# Patient Record
Sex: Male | Born: 1980 | Race: Black or African American | Hispanic: No | Marital: Single | Smoking: Current every day smoker
Health system: Southern US, Community
[De-identification: ages and names within clinical notes are randomized; demographics above are authoritative.]

---

## 2001-06-26 ENCOUNTER — Encounter: Payer: Self-pay | Admitting: Emergency Medicine

## 2001-06-26 ENCOUNTER — Emergency Department (HOSPITAL_COMMUNITY): Admission: EM | Admit: 2001-06-26 | Discharge: 2001-06-26 | Payer: Self-pay | Admitting: Emergency Medicine

## 2007-06-07 ENCOUNTER — Emergency Department (HOSPITAL_COMMUNITY): Admission: EM | Admit: 2007-06-07 | Discharge: 2007-06-08 | Payer: Self-pay | Admitting: Emergency Medicine

## 2007-10-20 ENCOUNTER — Emergency Department (HOSPITAL_COMMUNITY): Admission: EM | Admit: 2007-10-20 | Discharge: 2007-10-20 | Payer: Self-pay | Admitting: Family Medicine

## 2008-01-03 ENCOUNTER — Emergency Department (HOSPITAL_COMMUNITY): Admission: EM | Admit: 2008-01-03 | Discharge: 2008-01-03 | Payer: Self-pay | Admitting: Family Medicine

## 2008-02-28 ENCOUNTER — Emergency Department (HOSPITAL_COMMUNITY): Admission: EM | Admit: 2008-02-28 | Discharge: 2008-02-28 | Payer: Self-pay | Admitting: Family Medicine

## 2008-06-08 ENCOUNTER — Emergency Department (HOSPITAL_COMMUNITY): Admission: EM | Admit: 2008-06-08 | Discharge: 2008-06-08 | Payer: Self-pay | Admitting: Emergency Medicine

## 2008-07-09 ENCOUNTER — Emergency Department (HOSPITAL_COMMUNITY): Admission: EM | Admit: 2008-07-09 | Discharge: 2008-07-09 | Payer: Self-pay | Admitting: Emergency Medicine

## 2014-08-12 ENCOUNTER — Encounter (HOSPITAL_COMMUNITY): Payer: Self-pay | Admitting: Emergency Medicine

## 2014-08-12 ENCOUNTER — Emergency Department (HOSPITAL_COMMUNITY)
Admission: EM | Admit: 2014-08-12 | Discharge: 2014-08-13 | Disposition: A | Discharge status: Home / Self Care | Payer: Medicare Other | Attending: Emergency Medicine | Admitting: Emergency Medicine

## 2014-08-12 DIAGNOSIS — Z720 Tobacco use: Secondary | ICD-10-CM | POA: Insufficient documentation

## 2014-08-12 DIAGNOSIS — F1210 Cannabis abuse, uncomplicated: Secondary | ICD-10-CM | POA: Diagnosis not present

## 2014-08-12 DIAGNOSIS — R45850 Homicidal ideations: Secondary | ICD-10-CM

## 2014-08-12 DIAGNOSIS — F419 Anxiety disorder, unspecified: Secondary | ICD-10-CM | POA: Insufficient documentation

## 2014-08-12 DIAGNOSIS — F911 Conduct disorder, childhood-onset type: Secondary | ICD-10-CM | POA: Diagnosis not present

## 2014-08-12 DIAGNOSIS — Z046 Encounter for general psychiatric examination, requested by authority: Secondary | ICD-10-CM | POA: Diagnosis present

## 2014-08-12 DIAGNOSIS — R4689 Other symptoms and signs involving appearance and behavior: Secondary | ICD-10-CM

## 2014-08-12 DIAGNOSIS — F39 Unspecified mood [affective] disorder: Secondary | ICD-10-CM | POA: Diagnosis present

## 2014-08-12 LAB — ETHANOL

## 2014-08-12 LAB — SALICYLATE LEVEL

## 2014-08-12 LAB — CBC
HCT: 39.1 % (ref 39.0–52.0)
Hemoglobin: 13 g/dL (ref 13.0–17.0)
MCH: 26.9 pg (ref 26.0–34.0)
MCHC: 33.2 g/dL (ref 30.0–36.0)
MCV: 81 fL (ref 78.0–100.0)
PLATELETS: 284 10*3/uL (ref 150–400)
RBC: 4.83 MIL/uL (ref 4.22–5.81)
RDW: 14.3 % (ref 11.5–15.5)
WBC: 8.5 10*3/uL (ref 4.0–10.5)

## 2014-08-12 LAB — COMPREHENSIVE METABOLIC PANEL
ALT: 14 U/L — ABNORMAL LOW (ref 17–63)
AST: 18 U/L (ref 15–41)
Albumin: 4.3 g/dL (ref 3.5–5.0)
Alkaline Phosphatase: 83 U/L (ref 38–126)
Anion gap: 7 (ref 5–15)
BUN: 13 mg/dL (ref 6–20)
CO2: 26 mmol/L (ref 22–32)
Calcium: 9.3 mg/dL (ref 8.9–10.3)
Chloride: 105 mmol/L (ref 101–111)
Creatinine, Ser: 0.84 mg/dL (ref 0.61–1.24)
GFR calc non Af Amer: >60 mL/min (ref >60)
Glucose, Bld: 85 mg/dL (ref 65–99)
Potassium: 4.2 mmol/L (ref 3.5–5.1)
Sodium: 138 mmol/L (ref 135–145)
TOTAL PROTEIN: 7.7 g/dL (ref 6.5–8.1)
Total Bilirubin: 0.4 mg/dL (ref 0.3–1.2)

## 2014-08-12 LAB — RAPID URINE DRUG SCREEN, HOSP PERFORMED
Amphetamines: NOT DETECTED
Barbiturates: NOT DETECTED
Benzodiazepines: NOT DETECTED
Cocaine: NOT DETECTED
OPIATES: NOT DETECTED
Tetrahydrocannabinol: POSITIVE — AB

## 2014-08-12 LAB — ACETAMINOPHEN LEVEL: Acetaminophen (Tylenol), Serum: <10 ug/mL — ABNORMAL LOW (ref 10–30)

## 2014-08-12 MED ORDER — LORAZEPAM 0.5 MG PO TABS: 1.0000 mg | ORAL_TABLET | Freq: Three times a day (TID) | ORAL | Status: DC | PRN

## 2014-08-12 MED ORDER — ACETAMINOPHEN 325 MG PO TABS: 650.0000 mg | ORAL_TABLET | ORAL | Status: DC | PRN

## 2014-08-12 MED ORDER — IBUPROFEN 200 MG PO TABS: 600.0000 mg | ORAL_TABLET | Freq: Three times a day (TID) | ORAL | Status: DC | PRN

## 2014-08-12 MED ORDER — NICOTINE 21 MG/24HR TD PT24
21.0000 mg | MEDICATED_PATCH | Freq: Every day | TRANSDERMAL | Status: DC
  Administered 2014-08-12 – 2014-08-13 (×2): 21 mg via TRANSDERMAL
  Filled 2014-08-12 (×2): qty 1

## 2014-08-12 MED ORDER — ONDANSETRON HCL 4 MG PO TABS: 4.0000 mg | ORAL_TABLET | Freq: Three times a day (TID) | ORAL | Status: DC | PRN

## 2014-08-12 NOTE — ED Notes (Signed)
Pt sent [facility]  Pt has stopped taking his psych meds and he thinks he does not need them anymore  Pt threatened a resident [facility] with a box cutter today because he did not like the way he was looking at him  Pt is c/o toothache in triage

## 2014-08-12 NOTE — ED Notes (Signed)
Bed: [id number] Expected date: 08/12/14 Expected time: 7:04 AM Means of arrival: Ambulance Comments: Short of breath

## 2014-08-12 NOTE — ED Notes (Signed)
PA at bedside.

## 2014-08-12 NOTE — BH Assessment (Signed)
[facility] Assessment Progress Note  Per Olivia Mackieressa, pt has been declined for admission to [facility].  Doylene Canninghomas Nya Monds, MA Triage Specialist [phone]

## 2014-08-12 NOTE — ED Notes (Signed)
Pt was given motrin 800mg  [facility]100  [facility] is not to return to [facility]  Per paperwork the pt refused his psych meds because he thought they were trying to poison him

## 2014-08-12 NOTE — ED Provider Notes (Signed)
CSN: [id number]     Arrival date & time 08/12/14  4098 History   First MD Initiated Contact with Patient 08/12/14 0654     Chief Complaint  Patient presents with  . IVC      (Consider location/radiation/quality/duration/timing/severity/associated sxs/prior Treatment) HPI Charles Cross is a 34 y.o. male who is brought to the emergency department from [facility] under IVC. Patient apparently was threatening other residents of [facility] with box cutter. He was involuntary committed for homicidal ideations and psychiatric illness. Patient apparently stopped taking his psychiatric medications. Patient told me that "I have everything under control." He also told me that he wanted to "eat his face out if he came it me again." Patient states he does not have any suicidal ideations. He denies alcohol or drugs other marijuana. He is also complaining of a right upper dental pain that has been going on for years. He denies any injuries. He has no other complaints otherwise.  History reviewed. No pertinent past medical history. History reviewed. No pertinent past surgical history. Family History  Problem Relation Age of Onset  . Diabetes Mother   . Diabetes Other    History  Substance Use Topics  . Smoking status: Current Every Day Smoker  . Smokeless tobacco: Not on file  . Alcohol Use: Yes     Comment: rare    Review of Systems  Constitutional: Negative for fever and chills.  HENT: Positive for dental problem.   Respiratory: Negative for cough, chest tightness and shortness of breath.   Cardiovascular: Negative for chest pain, palpitations and leg swelling.  Gastrointestinal: Negative for nausea, vomiting, abdominal pain, diarrhea and abdominal distention.  Musculoskeletal: Negative for myalgias, neck pain and neck stiffness.  Skin: Negative for rash.  Allergic/Immunologic: Negative for immunocompromised state.  Neurological: Negative for dizziness, weakness, light-headedness, numbness and headaches.   Psychiatric/Behavioral: Positive for agitation. The patient is nervous/anxious.        Homicidal ideations  All other systems reviewed and are negative.     Allergies  Review of patient's allergies indicates no known allergies.  Home Medications   Prior to Admission medications   Not on File   BP 127/75 mmHg  Pulse 60  Temp(Src) 98.3 F (36.8 C) (Oral)  Resp 16  SpO2 100% Physical Exam  Constitutional: He is oriented to person, place, and time. He appears well-developed and well-nourished. No distress.  HENT:  Head: Normocephalic and atraumatic.  Nose: Nose normal.  Mouth/Throat: Oropharynx is clear and moist.  Normal dentition. TTP over right upper 3rd molar. No obvious decay or infection seen  Eyes: Conjunctivae are normal.  Neck: Neck supple.  Cardiovascular: Normal rate, regular rhythm and normal heart sounds.   Pulmonary/Chest: Effort normal. No respiratory distress. He has no wheezes. He has no rales.  Abdominal: Soft. Bowel sounds are normal. He exhibits no distension. There is no tenderness. There is no rebound.  Musculoskeletal: He exhibits no edema.  Neurological: He is alert and oriented to person, place, and time.  Skin: Skin is warm and dry.  Psychiatric: His mood appears anxious. His speech is delayed and tangential. He is slowed.  Avoiding eye contact  Nursing note and vitals reviewed.   ED Course  Procedures (including critical care time) Labs Review Labs Reviewed  COMPREHENSIVE METABOLIC PANEL - Abnormal; Notable for the following:    ALT 14 (*)    All other components within normal limits  ACETAMINOPHEN LEVEL - Abnormal; Notable for the following:    Acetaminophen (Tylenol), Serum <  10 (*)    All other components within normal limits  URINE RAPID DRUG SCREEN, HOSP PERFORMED - Abnormal; Notable for the following:    Tetrahydrocannabinol POSITIVE (*)    All other components within normal limits  ETHANOL  SALICYLATE LEVEL  CBC    Imaging  Review No results found.   EKG Interpretation None      MDM   Final diagnoses:  Aggressive behavior  Homicidal ideation     patient here under involuntary commitment, [facility] where he was already assessed. Patient admitted to me that he was angry at another person and wanted to cut him with a box cutter and eat his face. Patient apparently has been off of his psychiatric medications. He states he has everything under control. Will get TTS to assess in place as needed. Labs pending.   Pt medically cleared. IVCed. Waiting assessment and placement?   Filed Vitals:   08/12/14 0524 08/12/14 1420  BP: 127/75 118/91  Pulse: 60 75  Temp: 98.3 F (36.8 C) 98.7 F (37.1 C)  TempSrc: Oral Oral  Resp: 16 18  SpO2: 100% 100%     Jaynie Crumbleatyana Ida Uppal, PA-C 08/13/14 1645  John Molpus, MD 08/13/14 2241

## 2014-08-12 NOTE — ED Notes (Signed)
Pt states he is allergic to all psychiatric medications  States they make him want to hurt himself and other people when he takes them

## 2014-08-12 NOTE — ED Notes (Signed)
Pt awake, alert & responsive, no distress noted.  Pt requesting that door stay closed to room and stating that he doesn't take any meds.  Monitoring for safety, Q 15 min checks in effect.

## 2014-08-12 NOTE — Consult Note (Signed)
[place] Psychiatry Consult   Reason for Consult:  Mood disorder Referring Physician:  EDP Patient Identification: Jorge Retz MRN:  [id number] Principal Diagnosis: Mood disorder Diagnosis:   Patient Active Problem List   Diagnosis Date Noted  . Mood disorder [F39] 08/12/2014    Priority: High    Total Time spent with patient: 1 hour  Subjective:   Manfred Laspina is a 34 y.o. male patient admitted with Mood disorder.  HPI: AA male, 33 years old was evaluated after he was IVC by [facility] for threatening another client at [facility].  Patient reported that he is homeless and was at [facility] when a "gay man" tried to make a pass on him.  He admitted that he brought out his box cutter and warned the man that he he made another pass on him he will cut his face and eat him up.  Patient admitted to past mental illness as a child and stated that he was placed on medications for mental illness.  He stated that he stopped taking all of his medications long time ago because all of the medications made him sick.  Patient could not remember his past medications and when he actually stopped taking medications.  Patient presented angry and agitated when talking about the pass made at him by the ":gay man"  Patient will be observed  Overnight and will be re-evaluated in am.  He already stated that he is not willing to take any antipsychotic medications.  His UDS is positive for Marijuanana and Alcohol is less than 5.  HPI Elements:   Location:  Mood disorder, unspecified. Quality:  severe. Severity:  severe. Timing:  Acute. Duration:  sudden. Context:  IVC by [facility] for threatening another client at [facility].  Past Medical History: History reviewed. No pertinent past medical history. History reviewed. No pertinent past surgical history. Family History:  Family History  Problem Relation Age of Onset  . Diabetes Mother   . Diabetes Other    Social History:  History  Alcohol Use  . Yes    Comment: rare      History  Drug Use  . Yes  . Special: Marijuana    History   Social History  . Marital Status: Single    Spouse Name: N/A  . Number of Children: N/A  . Years of Education: N/A   Social History Main Topics  . Smoking status: Current Every Day Smoker  . Smokeless tobacco: Not on file  . Alcohol Use: Yes     Comment: rare  . Drug Use: Yes    Special: Marijuana  . Sexual Activity: Not on file   Other Topics Concern  . None   Social History Narrative  . None   Additional Social History:    Pain Medications: SEE MAR Prescriptions: SEE MAR Over the Counter: SEE MAR History of alcohol / drug use?: Yes Name of Substance 1: THC 1 - Age of First Use: 34 yrs old  1 - Amount (size/oz): 1-2 blunts 1 - Frequency: daily for several years 1 - Duration: on-going  1 - Last Use / Amount: yesterday                   Allergies:  No Known Allergies  Labs:  Results for orders placed or performed during the hospital encounter of 08/12/14 (from the past 48 hour(s))  Comprehensive metabolic panel     Status: Abnormal   Collection Time: 08/12/14  5:45 AM  Result Value Ref Range  Sodium 138 135 - 145 mmol/L   Potassium 4.2 3.5 - 5.1 mmol/L   Chloride 105 101 - 111 mmol/L   CO2 26 22 - 32 mmol/L   Glucose, Bld 85 65 - 99 mg/dL   BUN 13 6 - 20 mg/dL   Creatinine, Ser 0.84 0.61 - 1.24 mg/dL   Calcium 9.3 8.9 - 10.3 mg/dL   Total Protein 7.7 6.5 - 8.1 g/dL   Albumin 4.3 3.5 - 5.0 g/dL   AST 18 15 - 41 U/L   ALT 14 (L) 17 - 63 U/L   Alkaline Phosphatase 83 38 - 126 U/L   Total Bilirubin 0.4 0.3 - 1.2 mg/dL   GFR calc non Af Amer >60 >60 mL/min   GFR calc Af Amer >60 >60 mL/min    Comment: (NOTE) The eGFR has been calculated using the CKD EPI equation. This calculation has not been validated in all clinical situations. eGFR's persistently <60 mL/min signify possible Chronic Kidney Disease.    Anion gap 7 5 - 15  CBC     Status: None   Collection Time: 08/12/14  5:45 AM   Result Value Ref Range   WBC 8.5 4.0 - 10.5 K/uL   RBC 4.83 4.22 - 5.81 MIL/uL   Hemoglobin 13.0 13.0 - 17.0 g/dL   HCT 39.1 39.0 - 52.0 %   MCV 81.0 78.0 - 100.0 fL   MCH 26.9 26.0 - 34.0 pg   MCHC 33.2 30.0 - 36.0 g/dL   RDW 14.3 11.5 - 15.5 %   Platelets 284 150 - 400 K/uL  Ethanol (ETOH)     Status: None   Collection Time: 08/12/14  5:46 AM  Result Value Ref Range   Alcohol, Ethyl (B) <5 <5 mg/dL    Comment:        LOWEST DETECTABLE LIMIT FOR SERUM ALCOHOL IS 5 mg/dL FOR MEDICAL PURPOSES ONLY   Salicylate level     Status: None   Collection Time: 08/12/14  5:46 AM  Result Value Ref Range   Salicylate Lvl <3.3 2.8 - 30.0 mg/dL  Acetaminophen level     Status: Abnormal   Collection Time: 08/12/14  5:46 AM  Result Value Ref Range   Acetaminophen (Tylenol), Serum <10 (L) 10 - 30 ug/mL    Comment:        THERAPEUTIC CONCENTRATIONS VARY SIGNIFICANTLY. A RANGE OF 10-30 ug/mL MAY BE AN EFFECTIVE CONCENTRATION FOR MANY PATIENTS. HOWEVER, SOME ARE BEST TREATED AT CONCENTRATIONS OUTSIDE THIS RANGE. ACETAMINOPHEN CONCENTRATIONS >150 ug/mL AT 4 HOURS AFTER INGESTION AND >50 ug/mL AT 12 HOURS AFTER INGESTION ARE OFTEN ASSOCIATED WITH TOXIC REACTIONS.   Urine rapid drug screen (hosp performed)     Status: Abnormal   Collection Time: 08/12/14  5:47 AM  Result Value Ref Range   Opiates NONE DETECTED NONE DETECTED   Cocaine NONE DETECTED NONE DETECTED   Benzodiazepines NONE DETECTED NONE DETECTED   Amphetamines NONE DETECTED NONE DETECTED   Tetrahydrocannabinol POSITIVE (A) NONE DETECTED   Barbiturates NONE DETECTED NONE DETECTED    Comment:        DRUG SCREEN FOR MEDICAL PURPOSES ONLY.  IF CONFIRMATION IS NEEDED FOR ANY PURPOSE, NOTIFY LAB WITHIN 5 DAYS.        LOWEST DETECTABLE LIMITS FOR URINE DRUG SCREEN Drug Class       Cutoff (ng/mL) Amphetamine      1000 Barbiturate      200 Benzodiazepine   545 Tricyclics  300 Opiates          300 Cocaine           300 THC              50     Vitals: Blood pressure 122/48, pulse 58, temperature 98.2 F (36.8 C), temperature source Oral, resp. rate 14, SpO2 100 %.  Risk to Self: Suicidal Ideation: No Suicidal Intent: No Is patient at risk for suicide?: No Suicidal Plan?: No Access to Means: No What has been your use of drugs/alcohol within the last 12 months?:  (THC) How many times?:  ("[place]") Other Self Harm Risks:  (banged head on walls, punch brick, punch glass) Triggers for Past Attempts:  ("I can't take psych meds they make me destructive") Intentional Self Injurious Behavior: Damaging Comment - Self Injurious Behavior:  (pt has a hx of punching objects and head banging ) Risk to Others: Homicidal Ideation: No Thoughts of Harm to Others: No Current Homicidal Intent: No Current Homicidal Plan: No Access to Homicidal Means: No Identified Victim:  (n/a) History of harm to others?: No Assessment of Violence: None Noted Violent Behavior Description:  (patient is calm and cooperative ) Does patient have access to weapons?: No Criminal Charges Pending?: No Does patient have a court date: No Prior Inpatient Therapy: Prior Inpatient Therapy: Yes Prior Therapy Dates:  (multiple) Prior Therapy Facilty/Provider(s):  (Pt sts, "I don't know") Reason for Treatment:  (n/a) Prior Outpatient Therapy: Prior Outpatient Therapy: No Prior Therapy Dates:  (past-pt sts that he had a psychiatrist yrs ago in [place]) Prior Therapy Facilty/Provider(s):  (psychiatrist in [place] yrs ago ) Reason for Treatment:  (n/a) Does patient have an ACCT team?: No Does patient have Intensive In-House Services?  : No Does patient have [facility] services? : No Does patient have P4[facility] services?: No  Current Facility-Administered Medications  Medication Dose Route Frequency Provider Last Rate Last Dose  . acetaminophen (TYLENOL) tablet 650 mg  650 mg Oral Q4H PRN Tatyana Kirichenko, PA-C      . ibuprofen (ADVIL,MOTRIN) tablet  600 mg  600 mg Oral Q8H PRN Tatyana Kirichenko, PA-C      . LORazepam (ATIVAN) tablet 1 mg  1 mg Oral Q8H PRN Tatyana Kirichenko, PA-C      . nicotine (NICODERM CQ - dosed in mg/24 hours) patch 21 mg  21 mg Transdermal Daily Tatyana Kirichenko, PA-C   21 mg at 08/12/14 1619  . ondansetron (ZOFRAN) tablet 4 mg  4 mg Oral Q8H PRN Tatyana Kirichenko, PA-C       No current outpatient prescriptions on file.    Musculoskeletal: Strength & Muscle Tone: within normal limits Gait & Station: normal Patient leans: N/A  Psychiatric Specialty Exam: Physical Exam  Review of Systems  Constitutional: Negative.   HENT: Negative.   Eyes: Negative.   Respiratory: Negative.   Cardiovascular: Negative.   Gastrointestinal: Negative.   Genitourinary: Negative.   Skin: Negative.   Neurological: Negative.   Endo/Heme/Allergies: Negative.     Blood pressure 122/48, pulse 58, temperature 98.2 F (36.8 C), temperature source Oral, resp. rate 14, SpO2 100 %.There is no height or weight on file to calculate BMI.  General Appearance: Casual and Fairly Groomed  Engineer, water::  Good  Speech:  Clear and Coherent and Normal Rate  Volume:  Normal  Mood:  Angry and Anxious  Affect:  Congruent  Thought Process:  Coherent, Goal Directed and Intact  Orientation:  Full (Time, Place, and Person)  Thought  Content:  WDL  Suicidal Thoughts:  No  Homicidal Thoughts:  No  Memory:  Immediate;   Good Recent;   Good Remote;   Good  Judgement:  Poor  Insight:  Lacking  Psychomotor Activity:  Normal  Concentration:  Good  Recall:  Good  Fund of Knowledge:Poor  Language: Good  Akathisia:  NA  Handed:  Right  AIMS (if indicated):     Assets:  Desire for Improvement  ADL's:  Intact  Cognition: WNL  Sleep:      Medical Decision Making: Review of Psycho-Social Stressors (1)  Treatment Plan Summary: Daily contact with patient to assess and evaluate symptoms and progress in treatment and Medication  management  Plan:  Offer Trazodone 50 mg at bed time  for sleep, Ativan 1 mg po every 8 hours as needed for agitataion Disposition:  Re-evaluate in am for appropriate disposition.  Delfin Gant  PMHNP-BC 08/12/2014 6:03 PM Patient seen face-to-face for psychiatric evaluation, chart reviewed and case discussed with the physician extender and developed treatment plan. Reviewed the information documented and agree with the treatment plan. Corena Pilgrim, MD

## 2014-08-12 NOTE — Progress Notes (Signed)
[facility]p is [facility] [address] [place], [place] [id number] [phone]

## 2014-08-12 NOTE — ED Notes (Signed)
Oriented pt to room and unit.  Pt is agitated and does not understand why he is here.  He claims that his only problem is that he is homophobic.  He denies SI HI and AVH.   Pt is obsessively rubbing lotion into his hands. I took the lotion that [facility] let him keep because it had a sharp edge and stored it in his drawer.  He continues to say he is not mentally ill over and over and states that his body is a weapon.  He states he knows mixed martial arts.
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
## 2014-08-12 NOTE — Progress Notes (Signed)
Pt [facility].   Maryelizabeth Rowanressa Rheba Diamond, MSW, LCSW, LCAS Clinical Social Worker [phone]

## 2014-08-13 DIAGNOSIS — F911 Conduct disorder, childhood-onset type: Secondary | ICD-10-CM | POA: Diagnosis not present

## 2014-08-13 DIAGNOSIS — R4689 Other symptoms and signs involving appearance and behavior: Secondary | ICD-10-CM | POA: Insufficient documentation
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
## 2014-08-13 NOTE — BH Assessment (Signed)
BHH Assessment Progress Note  Per Thedore MinsMojeed Akintayo, MD, this pt does not require psychiatric hospitalization at this time.  [facility].  IVC has been rescinded.  Pt's discharged instructions include [facility] [place] where he plans to relocate.  Pt's nurse has been notified.  Doylene Canninghomas Toby Breithaupt, MA Triage Specialist [phone]

## 2014-08-13 NOTE — Progress Notes (Signed)
Pt to dc back to [place] today where family lives. Pt states [facility] and he will return to [place] on his own accord. CSW contacted [facility] to make them aware [facility] any confrontation.   Olga CoasterKristen Darek Eifler, LCSW  Clinical Social Work  [facility] [phone]

## 2014-08-13 NOTE — BH Assessment (Signed)
Seeking inpt placement as there are [facility].  Sent referrals to: [facility], [place], [place], [place]    Keeara Frees, [facility] Triage Specialist 08/13/2014 1:59 AM\

## 2014-08-13 NOTE — Discharge Instructions (Signed)
For your ongoing behavioral health needs, you are advised to follow up with [facility].  Contact them at your earliest opportunity to enroll for their services:       [facility]      [address]      [place], [place] [address]      [phone]

## 2014-08-13 NOTE — BHH Suicide Risk Assessment (Cosign Needed)
Suicide Risk Assessment  Discharge Assessment   [facility]   Demographic Factors:  Male, Low socioeconomic status and Unemployed  Total Time spent with patient: 20 minutes  Musculoskeletal: Strength & Muscle Tone: within normal limits Gait & Station: normal Patient leans: N/A  Psychiatric Specialty Exam:     Blood pressure 113/75, pulse 59, temperature 98.3 F (36.8 C), temperature source Oral, resp. rate 18, SpO2 100 %.There is no height or weight on file to calculate BMI.  General Appearance: Casual and Fairly Groomed  Patent attorneyye Contact::  Good  Speech:  Clear and Coherent and Normal Rate  Volume:  Normal  Mood:  Angry and Anxious  Affect:  Congruent  Thought Process:  Coherent, Goal Directed and Intact  Orientation:  Full (Time, Place, and Person)  Thought Content:  WDL  Suicidal Thoughts:  No  Homicidal Thoughts:  No  Memory:  Immediate;   Good Recent;   Good Remote;   Good  Judgement:  Poor  Insight:  Lacking  Psychomotor Activity:  Normal  Concentration:  Good  Recall:  Good  Fund of Knowledge:Poor  Language: Good  Akathisia:  NA  Handed:  Right  AIMS (if indicated):     Assets:  Desire for Improvement  ADL's:  Intact  Cognition: WNL  Sleep:          Has this patient used any form of tobacco in the last 30 days? (Cigarettes, Smokeless Tobacco, Cigars, and/or Pipes) No  Mental Status Per Nursing Assessment::   On Admission:     Current Mental Status by Physician: NA  Loss Factors: NA  Historical Factors: Prior suicide attempts  Risk Reduction Factors:   NA  Continued Clinical Symptoms:  Depression:   Insomnia  Cognitive Features That Contribute To Risk:  Polarized thinking    Suicide Risk:  Minimal: No identifiable suicidal ideation.  Patients presenting with no risk factors but with morbid ruminations; may be classified as minimal risk based on the severity of the depressive symptoms  Principal Problem: Mood  disorder Discharge Diagnoses:  Patient Active Problem List   Diagnosis Date Noted  . Mood disorder [F39] 08/12/2014    Priority: High  . Aggressive behavior [F60.89]     Follow-up Information    Schedule an appointment as soon as possible for a visit with [facility] .   Why:  This is your assigned medicaid [place] access doctor If you prefer another contact DSS [phone]   Contact information:   [facility] [address] [place], [place] [phone]      Plan Of Care/Follow-up recommendations:  Activity:  as tolerated Diet:  regular  Is patient on multiple antipsychotic therapies at discharge:  No   Has Patient had three or more failed trials of antipsychotic monotherapy by history:  No  Recommended Plan for Multiple Antipsychotic Therapies: NA    Maui Britten C   PMHNP-BC 08/13/2014, 2:16 PM
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
## 2014-08-23 DIAGNOSIS — Y9289 Other specified places as the place of occurrence of the external cause: Secondary | ICD-10-CM | POA: Insufficient documentation

## 2014-08-23 DIAGNOSIS — S29002A Unspecified injury of muscle and tendon of back wall of thorax, initial encounter: Secondary | ICD-10-CM | POA: Insufficient documentation

## 2014-08-23 DIAGNOSIS — Y9389 Activity, other specified: Secondary | ICD-10-CM | POA: Diagnosis not present

## 2014-08-23 DIAGNOSIS — Z720 Tobacco use: Secondary | ICD-10-CM | POA: Insufficient documentation

## 2014-08-23 DIAGNOSIS — Y998 Other external cause status: Secondary | ICD-10-CM | POA: Diagnosis not present

## 2014-08-23 NOTE — ED Notes (Signed)
Called pt in waiting room, No response.

## 2014-08-24 ENCOUNTER — Encounter (HOSPITAL_COMMUNITY): Payer: Self-pay | Admitting: Emergency Medicine

## 2014-08-24 ENCOUNTER — Emergency Department (HOSPITAL_COMMUNITY)
Admission: EM | Admit: 2014-08-24 | Discharge: 2014-08-24 | Disposition: A | Discharge status: Home / Self Care | Payer: Medicare Other | Attending: Emergency Medicine | Admitting: Emergency Medicine

## 2014-08-24 ENCOUNTER — Emergency Department (HOSPITAL_COMMUNITY): Payer: Medicare Other

## 2014-08-24 DIAGNOSIS — S29002A Unspecified injury of muscle and tendon of back wall of thorax, initial encounter: Secondary | ICD-10-CM | POA: Diagnosis not present

## 2014-08-24 DIAGNOSIS — S3992XA Unspecified injury of lower back, initial encounter: Secondary | ICD-10-CM

## 2014-08-24 MED ORDER — TRAMADOL HCL 50 MG PO TABS
50.0000 mg | ORAL_TABLET | Freq: Once | ORAL | Status: AC
  Administered 2014-08-24: 50 mg via ORAL
  Filled 2014-08-24: qty 1

## 2014-08-24 MED ORDER — TRAMADOL HCL 50 MG PO TABS: 50.0000 mg | ORAL_TABLET | Freq: Four times a day (QID) | ORAL | Status: AC | PRN

## 2014-08-24 MED ORDER — CYCLOBENZAPRINE HCL 10 MG PO TABS: 10.0000 mg | ORAL_TABLET | Freq: Two times a day (BID) | ORAL | Status: AC | PRN

## 2014-08-24 MED ORDER — CYCLOBENZAPRINE HCL 10 MG PO TABS
10.0000 mg | ORAL_TABLET | Freq: Once | ORAL | Status: AC
  Administered 2014-08-24: 10 mg via ORAL
  Filled 2014-08-24: qty 1
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
## 2014-08-24 NOTE — Discharge Instructions (Signed)
Take Tramadol as needed for pain. Take flexeril as needed for muscle spasm. You may take these medications together.
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
## 2014-08-24 NOTE — ED Notes (Signed)
Pt left with all his belongings and ambulated out of treatment area.  

## 2015-11-09 ENCOUNTER — Encounter (HOSPITAL_COMMUNITY): Payer: Self-pay

## 2015-11-09 ENCOUNTER — Emergency Department (HOSPITAL_COMMUNITY)
Admission: EM | Admit: 2015-11-09 | Discharge: 2015-11-10 | Disposition: A | Discharge status: Home / Self Care | Payer: Medicare Other | Attending: Emergency Medicine | Admitting: Emergency Medicine

## 2015-11-09 DIAGNOSIS — Y939 Activity, unspecified: Secondary | ICD-10-CM | POA: Diagnosis not present

## 2015-11-09 DIAGNOSIS — T07XXXA Unspecified multiple injuries, initial encounter: Secondary | ICD-10-CM

## 2015-11-09 DIAGNOSIS — S4992XA Unspecified injury of left shoulder and upper arm, initial encounter: Secondary | ICD-10-CM | POA: Diagnosis present

## 2015-11-09 DIAGNOSIS — S50312A Abrasion of left elbow, initial encounter: Secondary | ICD-10-CM | POA: Diagnosis not present

## 2015-11-09 DIAGNOSIS — Y92410 Unspecified street and highway as the place of occurrence of the external cause: Secondary | ICD-10-CM | POA: Diagnosis not present

## 2015-11-09 DIAGNOSIS — T148XXA Other injury of unspecified body region, initial encounter: Secondary | ICD-10-CM | POA: Diagnosis not present

## 2015-11-09 DIAGNOSIS — Y999 Unspecified external cause status: Secondary | ICD-10-CM | POA: Insufficient documentation

## 2015-11-09 DIAGNOSIS — F17200 Nicotine dependence, unspecified, uncomplicated: Secondary | ICD-10-CM | POA: Diagnosis not present
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
## 2015-11-10 DIAGNOSIS — T148XXA Other injury of unspecified body region, initial encounter: Secondary | ICD-10-CM | POA: Diagnosis not present

## 2015-11-10 MED ORDER — HYDROCODONE-ACETAMINOPHEN 5-325 MG PO TABS
1.0000 | ORAL_TABLET | Freq: Once | ORAL | Status: AC
  Administered 2015-11-10: 1 via ORAL
  Filled 2015-11-10: qty 1

## 2015-11-10 MED ORDER — IBUPROFEN 200 MG PO TABS
600.0000 mg | ORAL_TABLET | Freq: Once | ORAL | Status: AC
  Administered 2015-11-10: 600 mg via ORAL
  Filled 2015-11-10: qty 3

## 2015-11-10 NOTE — ED Notes (Signed)
Pt is now complaining of LEFT hip pain when walking 2/10

## 2015-11-10 NOTE — ED Notes (Signed)
Discharge instructions reviewed. Pt verbalizes understanding. Attempted to obtain e-signature but pad not working. PT escorted to wr by security.
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
## 2015-11-10 NOTE — ED Notes (Signed)
When asking pt about medical problems pt states "i don't have any. I used to be deemed incompetent but I got my competency back. That psychiatrist has me on all these meds that made me homicidal and suicidal but I quit taking them and I don't have any problems". I continued to ask pt about hx and he would not tell me what he was seeing the psychiatrist for.
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
## 2017-07-03 ENCOUNTER — Emergency Department (HOSPITAL_COMMUNITY)
Admission: EM | Admit: 2017-07-03 | Discharge: 2017-07-04 | Disposition: A | Discharge status: Home / Self Care | Payer: Medicare Other | Attending: Emergency Medicine | Admitting: Emergency Medicine

## 2017-07-03 ENCOUNTER — Other Ambulatory Visit: Payer: Self-pay

## 2017-07-03 ENCOUNTER — Encounter (HOSPITAL_COMMUNITY): Payer: Self-pay | Admitting: Nurse Practitioner

## 2017-07-03 DIAGNOSIS — F17210 Nicotine dependence, cigarettes, uncomplicated: Secondary | ICD-10-CM | POA: Diagnosis not present

## 2017-07-03 DIAGNOSIS — Z79899 Other long term (current) drug therapy: Secondary | ICD-10-CM | POA: Insufficient documentation

## 2017-07-03 DIAGNOSIS — R4689 Other symptoms and signs involving appearance and behavior: Secondary | ICD-10-CM | POA: Diagnosis not present

## 2017-07-03 DIAGNOSIS — F39 Unspecified mood [affective] disorder: Secondary | ICD-10-CM | POA: Insufficient documentation

## 2017-07-03 DIAGNOSIS — Z046 Encounter for general psychiatric examination, requested by authority: Secondary | ICD-10-CM | POA: Insufficient documentation

## 2017-07-03 DIAGNOSIS — R451 Restlessness and agitation: Secondary | ICD-10-CM | POA: Diagnosis not present

## 2017-07-03 DIAGNOSIS — R454 Irritability and anger: Secondary | ICD-10-CM | POA: Diagnosis not present

## 2017-07-03 LAB — ETHANOL: Alcohol, Ethyl (B): <10 mg/dL (ref <10)

## 2017-07-03 LAB — COMPREHENSIVE METABOLIC PANEL
ALT: 13 U/L — ABNORMAL LOW (ref 17–63)
ANION GAP: 9 (ref 5–15)
AST: 18 U/L (ref 15–41)
Albumin: 4.3 g/dL (ref 3.5–5.0)
Alkaline Phosphatase: 64 U/L (ref 38–126)
BUN: 11 mg/dL (ref 6–20)
CHLORIDE: 104 mmol/L (ref 101–111)
CO2: 24 mmol/L (ref 22–32)
CREATININE: 0.83 mg/dL (ref 0.61–1.24)
Calcium: 9.2 mg/dL (ref 8.9–10.3)
GFR calc non Af Amer: >60 mL/min (ref >60)
Glucose, Bld: 83 mg/dL (ref 65–99)
POTASSIUM: 3.5 mmol/L (ref 3.5–5.1)
SODIUM: 137 mmol/L (ref 135–145)
Total Bilirubin: 0.8 mg/dL (ref 0.3–1.2)
Total Protein: 7.2 g/dL (ref 6.5–8.1)

## 2017-07-03 LAB — RAPID URINE DRUG SCREEN, HOSP PERFORMED
AMPHETAMINES: NOT DETECTED
BARBITURATES: NOT DETECTED
BENZODIAZEPINES: NOT DETECTED
COCAINE: NOT DETECTED
Opiates: NOT DETECTED
Tetrahydrocannabinol: POSITIVE — AB

## 2017-07-03 LAB — CBC
HEMATOCRIT: 34.9 % — AB (ref 39.0–52.0)
HEMOGLOBIN: 11.7 g/dL — AB (ref 13.0–17.0)
MCH: 27.3 pg (ref 26.0–34.0)
MCHC: 33.5 g/dL (ref 30.0–36.0)
MCV: 81.4 fL (ref 78.0–100.0)
Platelets: 243 10*3/uL (ref 150–400)
RBC: 4.29 MIL/uL (ref 4.22–5.81)
RDW: 14 % (ref 11.5–15.5)
WBC: 9.3 10*3/uL (ref 4.0–10.5)

## 2017-07-03 MED ORDER — ACETAMINOPHEN 325 MG PO TABS: 650.0000 mg | ORAL_TABLET | ORAL | Status: DC | PRN

## 2017-07-03 MED ORDER — ONDANSETRON HCL 4 MG PO TABS: 4.0000 mg | ORAL_TABLET | Freq: Three times a day (TID) | ORAL | Status: DC | PRN

## 2017-07-03 MED ORDER — DIPHENHYDRAMINE HCL 50 MG/ML IJ SOLN
50.0000 mg | Freq: Once | INTRAMUSCULAR | Status: AC
Start: 2017-07-03 — End: 2017-07-03
  Administered 2017-07-03: 50 mg via INTRAMUSCULAR
  Filled 2017-07-03: qty 1

## 2017-07-03 MED ORDER — ALUM & MAG HYDROXIDE-SIMETH 200-200-20 MG/5ML PO SUSP: 30.0000 mL | Freq: Four times a day (QID) | ORAL | Status: DC | PRN

## 2017-07-03 MED ORDER — ZOLPIDEM TARTRATE 5 MG PO TABS: 5.0000 mg | ORAL_TABLET | Freq: Every evening | ORAL | Status: DC | PRN

## 2017-07-03 MED ORDER — LORAZEPAM 2 MG/ML IJ SOLN
2.0000 mg | Freq: Once | INTRAMUSCULAR | Status: AC
  Administered 2017-07-03: 2 mg via INTRAMUSCULAR
  Filled 2017-07-03: qty 1

## 2017-07-03 MED ORDER — ZIPRASIDONE MESYLATE 20 MG IM SOLR
20.0000 mg | Freq: Once | INTRAMUSCULAR | Status: AC
  Administered 2017-07-03: 20 mg via INTRAMUSCULAR
  Filled 2017-07-03: qty 20

## 2017-07-03 MED ORDER — STERILE WATER FOR INJECTION IJ SOLN
INTRAMUSCULAR | Status: AC
  Administered 2017-07-03: 16:00:00
  Filled 2017-07-03: qty 10
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
## 2017-07-03 NOTE — ED Notes (Signed)
Bed: WBH34 Expected date:  Expected time:  Means of arrival:  Comments: Hold for 31 

## 2017-07-03 NOTE — ED Notes (Signed)
Patient is resting comfortably. 
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
## 2017-07-03 NOTE — ED Notes (Signed)
Report given to [facility] RN 
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
## 2017-07-03 NOTE — ED Notes (Signed)
Bed: WA31 Expected date:  Expected time:  Means of arrival:  Comments: 

## 2017-07-04 DIAGNOSIS — F17210 Nicotine dependence, cigarettes, uncomplicated: Secondary | ICD-10-CM

## 2017-07-04 DIAGNOSIS — R4689 Other symptoms and signs involving appearance and behavior: Secondary | ICD-10-CM | POA: Diagnosis not present

## 2017-07-04 DIAGNOSIS — R454 Irritability and anger: Secondary | ICD-10-CM | POA: Diagnosis not present

## 2017-07-04 DIAGNOSIS — R451 Restlessness and agitation: Secondary | ICD-10-CM
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
## 2017-07-04 NOTE — ED Notes (Signed)
Pt escorted out calmly with security.  All belongings were returned to patient.

## 2017-07-04 NOTE — [facility] Suicide Risk Assessment (Signed)
Suicide Risk Assessment  Discharge Assessment   [facility]   Principal Problem: Aggressive behavior Discharge Diagnoses:  Patient Active Problem List   Diagnosis Date Noted  . Aggressive behavior [R46.89]     Priority: High  . Mood disorder (HCC) [F39] 08/12/2014    Total Time spent with patient: 45 minutes  Musculoskeletal: Strength & Muscle Tone: within normal limits Gait & Station: normal Patient leans: N/A  Psychiatric Specialty Exam: Physical Exam  Constitutional: He is oriented to person, place, and time. He appears well-developed and well-nourished.  HENT:  Head: Normocephalic.  Neck: Normal range of motion.  Respiratory: Effort normal.  Musculoskeletal: Normal range of motion.  Neurological: He is alert and oriented to person, place, and time.  Psychiatric: He has a normal mood and affect. His speech is normal and behavior is normal. Judgment and thought content normal. Cognition and memory are normal.    Review of Systems  Psychiatric/Behavioral:       Anxiety  All other systems reviewed and are negative.   Height 6\' 1"  (1.854 m), weight 85.3 kg (188 lb).Body mass index is 24.8 kg/m.  General Appearance: Casual  Eye Contact:  Good  Speech:  Normal Rate  Volume:  Normal  Mood:  Irritable  Affect:  Congruent  Thought Process:  Coherent and Descriptions of Associations: Intact  Orientation:  Full (Time, Place, and Person)  Thought Content:  WDL and Logical  Suicidal Thoughts:  No  Homicidal Thoughts:  No  Memory:  Immediate;   Good Recent;   Good Remote;   Good  Judgement:  Fair  Insight:  Fair  Psychomotor Activity:  Normal  Concentration:  Concentration: Good and Attention Span: Good  Recall:  Good  Fund of Knowledge:  Fair  Language:  Good  Akathisia:  No  Handed:  Right  AIMS (if indicated):     Assets:  Leisure Time Physical Health Resilience Social Support  ADL's:  Intact  Cognition:  WNL  Sleep:      Mental  Status Per Nursing Assessment::   On Admission:   aggression  Demographic Factors:  Male  Loss Factors: NA  Historical Factors: NA  Risk Reduction Factors:   Sense of responsibility to family  Continued Clinical Symptoms:  Irritablility  Cognitive Features That Contribute To Risk:  None    Suicide Risk:  Minimal: No identifiable suicidal ideation.  Patients presenting with no risk factors but with morbid ruminations; may be classified as minimal risk based on the severity of the depressive symptoms    Plan Of Care/Follow-up recommendations:  Activity:  as tolerated Diet:  heart healthy diet  Ronson Hagins, NP 07/04/2017, 4:23 PM

## 2018-07-16 ENCOUNTER — Encounter (HOSPITAL_COMMUNITY): Payer: Self-pay | Admitting: Pharmacy Technician

## 2018-07-16 ENCOUNTER — Emergency Department (HOSPITAL_COMMUNITY)
Admission: EM | Admit: 2018-07-16 | Discharge: 2018-07-16 | Disposition: A | Discharge status: Home / Self Care | Payer: Medicare Other | Attending: Emergency Medicine | Admitting: Emergency Medicine

## 2018-07-16 ENCOUNTER — Other Ambulatory Visit: Payer: Self-pay

## 2018-07-16 ENCOUNTER — Emergency Department (HOSPITAL_COMMUNITY): Payer: Medicare Other

## 2018-07-16 DIAGNOSIS — Y939 Activity, unspecified: Secondary | ICD-10-CM | POA: Diagnosis not present

## 2018-07-16 DIAGNOSIS — Y929 Unspecified place or not applicable: Secondary | ICD-10-CM | POA: Insufficient documentation

## 2018-07-16 DIAGNOSIS — S68119A Complete traumatic metacarpophalangeal amputation of unspecified finger, initial encounter: Secondary | ICD-10-CM

## 2018-07-16 DIAGNOSIS — Z23 Encounter for immunization: Secondary | ICD-10-CM | POA: Diagnosis not present

## 2018-07-16 DIAGNOSIS — W230XXA Caught, crushed, jammed, or pinched between moving objects, initial encounter: Secondary | ICD-10-CM | POA: Diagnosis not present

## 2018-07-16 DIAGNOSIS — F17210 Nicotine dependence, cigarettes, uncomplicated: Secondary | ICD-10-CM | POA: Diagnosis not present

## 2018-07-16 DIAGNOSIS — Z03818 Encounter for observation for suspected exposure to other biological agents ruled out: Secondary | ICD-10-CM | POA: Diagnosis not present

## 2018-07-16 DIAGNOSIS — S6991XA Unspecified injury of right wrist, hand and finger(s), initial encounter: Secondary | ICD-10-CM | POA: Diagnosis present

## 2018-07-16 DIAGNOSIS — F1290 Cannabis use, unspecified, uncomplicated: Secondary | ICD-10-CM | POA: Diagnosis not present

## 2018-07-16 DIAGNOSIS — S68110A Complete traumatic metacarpophalangeal amputation of right index finger, initial encounter: Secondary | ICD-10-CM | POA: Insufficient documentation

## 2018-07-16 LAB — CBC
HCT: 37.2 % — ABNORMAL LOW (ref 39.0–52.0)
Hemoglobin: 12.2 g/dL — ABNORMAL LOW (ref 13.0–17.0)
MCH: 27.2 pg (ref 26.0–34.0)
MCHC: 32.8 g/dL (ref 30.0–36.0)
MCV: 83 fL (ref 80.0–100.0)
Platelets: 261 10*3/uL (ref 150–400)
RBC: 4.48 MIL/uL (ref 4.22–5.81)
RDW: 14.3 % (ref 11.5–15.5)
WBC: 8.4 10*3/uL (ref 4.0–10.5)
nRBC: 0 % (ref 0.0–0.2)

## 2018-07-16 LAB — BASIC METABOLIC PANEL
Anion gap: 7 (ref 5–15)
BUN: 12 mg/dL (ref 6–20)
CO2: 24 mmol/L (ref 22–32)
Calcium: 9 mg/dL (ref 8.9–10.3)
Chloride: 107 mmol/L (ref 98–111)
Creatinine, Ser: 0.89 mg/dL (ref 0.61–1.24)
GFR calc Af Amer: >60 mL/min (ref >60)
GFR calc non Af Amer: >60 mL/min (ref >60)
Glucose, Bld: 104 mg/dL — ABNORMAL HIGH (ref 70–99)
Potassium: 4.3 mmol/L (ref 3.5–5.1)
Sodium: 138 mmol/L (ref 135–145)

## 2018-07-16 LAB — SARS CORONAVIRUS 2: SARS Coronavirus 2: NOT DETECTED

## 2018-07-16 MED ORDER — MORPHINE SULFATE (PF) 4 MG/ML IV SOLN
4.0000 mg | Freq: Once | INTRAVENOUS | Status: AC
  Administered 2018-07-16: 4 mg via INTRAVENOUS
  Filled 2018-07-16: qty 1

## 2018-07-16 MED ORDER — CEFAZOLIN SODIUM-DEXTROSE 2-4 GM/100ML-% IV SOLN
2.0000 g | Freq: Once | INTRAVENOUS | Status: AC
  Administered 2018-07-16: 2 g via INTRAVENOUS
  Filled 2018-07-16: qty 100

## 2018-07-16 MED ORDER — TETANUS-DIPHTH-ACELL PERTUSSIS 5-2.5-18.5 LF-MCG/0.5 IM SUSP
0.5000 mL | Freq: Once | INTRAMUSCULAR | Status: AC
  Administered 2018-07-16: 0.5 mL via INTRAMUSCULAR
  Filled 2018-07-16: qty 0.5

## 2018-07-16 MED ORDER — ACETAMINOPHEN 325 MG PO TABS: 650.0000 mg | ORAL_TABLET | Freq: Four times a day (QID) | ORAL | Status: AC

## 2018-07-16 MED ORDER — CEPHALEXIN 500 MG PO CAPS: 500.0000 mg | ORAL_CAPSULE | Freq: Four times a day (QID) | ORAL | 0 refills | Status: AC

## 2018-07-16 MED ORDER — BUPIVACAINE HCL (PF) 0.5 % IJ SOLN
10.0000 mL | Freq: Once | INTRAMUSCULAR | Status: AC
  Administered 2018-07-16: 10 mL
  Filled 2018-07-16: qty 10

## 2018-07-16 MED ORDER — LIDOCAINE-EPINEPHRINE (PF) 2 %-1:200000 IJ SOLN
10.0000 mL | Freq: Once | INTRAMUSCULAR | Status: AC
  Administered 2018-07-16: 10 mL
  Filled 2018-07-16: qty 20

## 2018-07-16 MED ORDER — IBUPROFEN 200 MG PO TABS: 600.0000 mg | ORAL_TABLET | Freq: Four times a day (QID) | ORAL | Status: AC
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
## 2018-07-16 NOTE — ED Notes (Signed)
Last ate/drank at 1415
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
## 2018-07-16 NOTE — Discharge Instructions (Addendum)
Keep bandage on, clean and dry until you come to the office  Move your finger so it doesn't get stiff.  Work to make a full fist and then to fully straighten your finger.  Please take all of your antibiotics until finished!   You may develop abdominal discomfort or diarrhea from the antibiotic.  You may help offset this with probiotics which you can buy or get in yogurt. Do not eat  or take the probiotics until 2 hours after your antibiotic.   You can alternate 600 mg of ibuprofen and [phone] mg of Tylenol every 3 hours as needed for pain. Do not exceed 4000 mg of Tylenol daily.  Take ibuprofen with food to avoid upset stomach issues.  Follow-up in the office of Dr. Wynetta Emery.

## 2018-07-16 NOTE — ED Notes (Signed)
Finger tip placed on wet dressing, placed in bag, and labeled. Bag placed on ice bag.

## 2018-07-16 NOTE — ED Notes (Signed)
Patient Alert and oriented to baseline. Stable and ambulatory to baseline. Patient verbalized understanding of the discharge instructions.  Patient belongings were taken by the patient.   

## 2018-07-16 NOTE — Consult Note (Signed)
ORTHOPAEDIC CONSULTATION HISTORY & PHYSICAL REQUESTING PHYSICIAN: Charles Flakes, MD  Chief Complaint: R IF injury  HPI: Charles Cross is a 38 y.o. male who amputated the distal aspect of his right index finger when it was caught in a door earlier today.  He consumed 3 peanut butter and jelly sandwiches before arriving in the emergency department, with his amputated part in hand.  History reviewed. No pertinent past medical history. History reviewed. No pertinent surgical history. Social History   Socioeconomic History  . Marital status: Single    Spouse name: Not on file  . Number of children: Not on file  . Years of education: Not on file  . Highest education level: Not on file  Occupational History  . Not on file  Social Needs  . Financial resource strain: Not on file  . Food insecurity    Worry: Not on file    Inability: Not on file  . Transportation needs    Medical: Not on file    Non-medical: Not on file  Tobacco Use  . Smoking status: Current Every Day Smoker    Packs/day: 1.00  . Smokeless tobacco: Never Used  Substance and Sexual Activity  . Alcohol use: Yes    Comment: rare  . Drug use: Yes    Types: Marijuana  . Sexual activity: Not on file  Lifestyle  . Physical activity    Days per week: Not on file    Minutes per session: Not on file  . Stress: Not on file  Relationships  . Social Herbalist on phone: Not on file    Gets together: Not on file    Attends religious service: Not on file    Active member of club or organization: Not on file    Attends meetings of clubs or organizations: Not on file    Relationship status: Not on file  Other Topics Concern  . Not on file  Social History Narrative  . Not on file   Family History  Problem Relation Age of Onset  . Diabetes Mother   . Diabetes Other    No Known Allergies Prior to Admission medications   Medication Sig Start Date End Date Taking? Authorizing Provider  cyclobenzaprine  (FLEXERIL) 10 MG tablet Take 1 tablet (10 mg total) by mouth 2 (two) times daily as needed for muscle spasms. Patient not taking: Reported on 07/03/2017 08/24/14   Alvina Chou, PA-C  traMADol (ULTRAM) 50 MG tablet Take 1 tablet (50 mg total) by mouth every 6 (six) hours as needed. Patient not taking: Reported on 07/03/2017 08/24/14   Alvina Chou, PA-C   Dg Finger Index Right  Result Date: 07/16/2018 CLINICAL DATA:  38 year old male EXAM: RIGHT INDEX FINGER 2+V COMPARISON:  None. FINDINGS: Amputation of the distal first finger of the right hand, with absent soft tissue and bone through the distal phalanx. Fracture at the base of the distal phalanx. IMPRESSION: Acute amputation of the distal first finger of the right hand, through the base of the distal phalanx. Fracture at the base of the distal phalanx. Electronically Signed   By: Corrie Mckusick D.O.   On: 07/16/2018 15:53    Positive ROS: All other systems have been reviewed and were otherwise negative with the exception of those mentioned in the HPI and as above.  Physical Exam: Vitals: Refer to EMR. Constitutional:  WD, WN, NAD HEENT:  NCAT, EOMI Neuro/Psych:  Alert & oriented to person, place, and time; appropriate mood &  affect Lymphatic: No generalized extremity edema or lymphadenopathy Extremities / MSK:  The extremities are normal with respect to appearance, ranges of motion, joint stability, muscle strength/tone, sensation, & perfusion except as otherwise noted:  Right index finger obliquely amputated through the proximal portion of the distal phalanx.  There is a longer radial sided flap, exposed bone, and the nail is missing, as is what appears to be both the germinal and sterile matrices.  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  Digital block was performed by me with a mixture of lidocaine Marcaine Baring epinephrine.  A turnicot was applied to the digit.  The digit was then cleansed vigorously under running water at the sink.  It was prepped with Betadine and draped in standard fashion.  Using a scalpel, the remnants of the distal phalanx were removed.  Rondure was then used to remove the bulbous condyles of the head of the middle phalanx, smoothing and shaping it and sufficiently shortening it to allow closure with flaps that were present.  The flaps were excisionally debrided of jagged skin edges and dirty or nonviable subcutaneous tissues.  The wound was copiously irrigated.  The flap was inset with 4-0 Vicryl Rapide interrupted sutures.  A dressing was applied and the process the turnicot was removed.  The substantiated revision amputation through the distal aspect of P2, creating local flap for closure.  I provided discharge instructions to him.  He is already received IV antibiotics in the emergency department and his tetanus was updated.  He will be discharged with a prescription for a few days of oral Keflex, and typical discharge instructions.  My office will contact him tomorrow to arrange follow-up for the week of 6-22.  We discussed analgesic regimens, and he verbalized agreement with trying ibuprofen and Tylenol initially.  [name], MD      Orthopaedic & Hand Surgery [facility] [address] [place], [place]  [id number] Office: [phone] Mobile: [phone]  07/16/2018, 4:29 PM

## 2018-07-18 ENCOUNTER — Inpatient Hospital Stay: Admit: 2018-07-18 | Payer: Medicare Other | Admitting: Orthopedic Surgery

## 2018-07-18 SURGERY — AMPUTATION DIGIT
Anesthesia: General | Laterality: Right

## 2020-06-18 IMAGING — CR RIGHT INDEX FINGER 2+V
3 series · 3 of 3 positions shown · non-contrast
Comparison: None.

CLINICAL DATA: 37-year-old male

EXAM:
RIGHT INDEX FINGER 2+V

[finger ap]
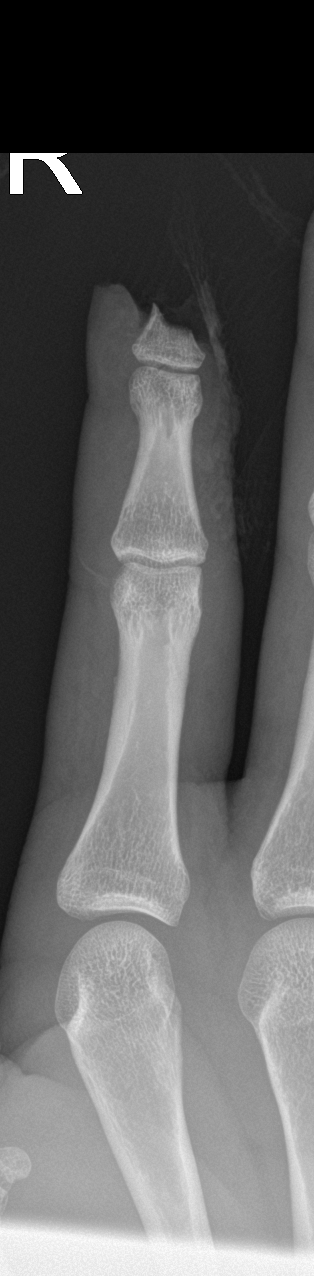

[finger obl]
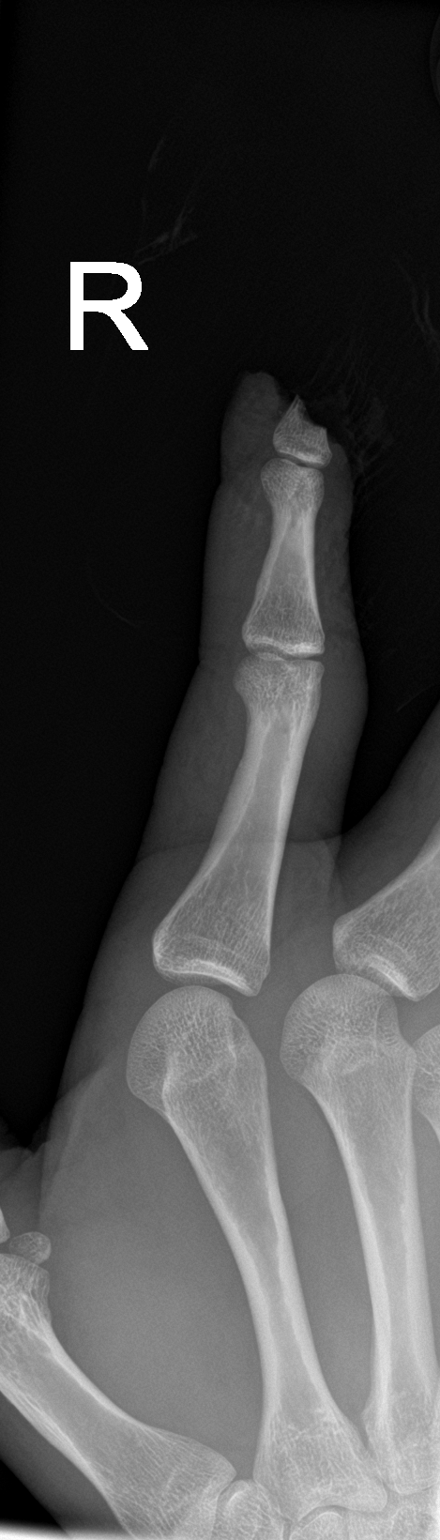

[finger lat]
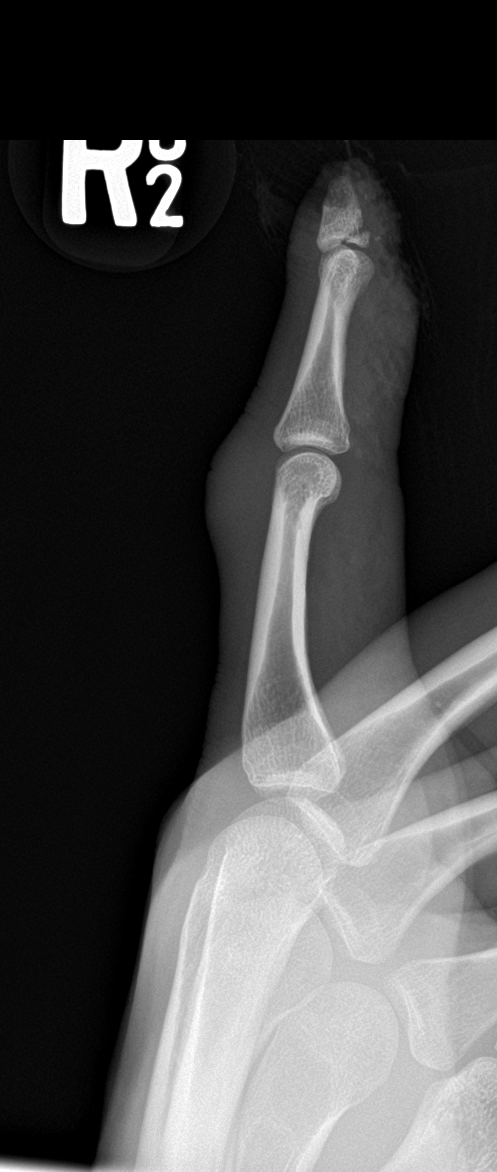

[3 of 3 positions shown; findings below may reference images not displayed]

FINDINGS: Amputation of the distal first finger of the right hand, with absent
soft tissue and bone through the distal phalanx. Fracture at the
base of the distal phalanx.
IMPRESSION: Acute amputation of the distal first finger of the right hand,
through the base of the distal phalanx. Fracture at the base of the
distal phalanx.
